# Patient Record
Sex: Female | Born: 1975 | Race: Black or African American | Hispanic: No | Marital: Married | Smoking: Never smoker
Health system: Southern US, Community
[De-identification: ages and names within clinical notes are randomized; demographics above are authoritative.]

## PROBLEM LIST (undated history)

## (undated) DIAGNOSIS — G35 Multiple sclerosis: Secondary | ICD-10-CM

---

## 2019-02-08 ENCOUNTER — Other Ambulatory Visit: Payer: Self-pay

## 2019-02-08 ENCOUNTER — Emergency Department
Admission: EM | Admit: 2019-02-08 | Discharge: 2019-02-08 | Disposition: A | Discharge status: Home / Self Care | Payer: No Typology Code available for payment source | Attending: Emergency Medicine | Admitting: Emergency Medicine

## 2019-02-08 ENCOUNTER — Emergency Department: Payer: No Typology Code available for payment source

## 2019-02-08 ENCOUNTER — Encounter: Payer: Self-pay | Admitting: Intensive Care

## 2019-02-08 DIAGNOSIS — Y999 Unspecified external cause status: Secondary | ICD-10-CM | POA: Insufficient documentation

## 2019-02-08 DIAGNOSIS — Q438 Other specified congenital malformations of intestine: Secondary | ICD-10-CM | POA: Diagnosis not present

## 2019-02-08 DIAGNOSIS — M542 Cervicalgia: Secondary | ICD-10-CM | POA: Diagnosis not present

## 2019-02-08 DIAGNOSIS — Y92410 Unspecified street and highway as the place of occurrence of the external cause: Secondary | ICD-10-CM | POA: Insufficient documentation

## 2019-02-08 DIAGNOSIS — R079 Chest pain, unspecified: Secondary | ICD-10-CM | POA: Diagnosis not present

## 2019-02-08 DIAGNOSIS — Y9389 Activity, other specified: Secondary | ICD-10-CM | POA: Diagnosis not present

## 2019-02-08 DIAGNOSIS — Z3202 Encounter for pregnancy test, result negative: Secondary | ICD-10-CM | POA: Diagnosis not present

## 2019-02-08 DIAGNOSIS — Q458 Other specified congenital malformations of digestive system: Secondary | ICD-10-CM

## 2019-02-08 DIAGNOSIS — R51 Headache: Secondary | ICD-10-CM | POA: Insufficient documentation

## 2019-02-08 DIAGNOSIS — R1030 Lower abdominal pain, unspecified: Secondary | ICD-10-CM | POA: Insufficient documentation

## 2019-02-08 HISTORY — DX: Multiple sclerosis: G35

## 2019-02-08 LAB — CBC WITH DIFFERENTIAL/PLATELET
Abs Immature Granulocytes: 0.04 10*3/uL (ref 0.00–0.07)
Basophils Absolute: 0.1 10*3/uL (ref 0.0–0.1)
Basophils Relative: 0 %
Eosinophils Absolute: 0.1 10*3/uL (ref 0.0–0.5)
Eosinophils Relative: 1 %
HCT: 37.2 % (ref 36.0–46.0)
Hemoglobin: 11.8 g/dL — ABNORMAL LOW (ref 12.0–15.0)
Immature Granulocytes: 0 %
Lymphocytes Relative: 18 %
Lymphs Abs: 2.1 10*3/uL (ref 0.7–4.0)
MCH: 28.9 pg (ref 26.0–34.0)
MCHC: 31.7 g/dL (ref 30.0–36.0)
MCV: 91 fL (ref 80.0–100.0)
Monocytes Absolute: 0.8 10*3/uL (ref 0.1–1.0)
Monocytes Relative: 7 %
Neutro Abs: 8.1 10*3/uL — ABNORMAL HIGH (ref 1.7–7.7)
Neutrophils Relative %: 74 %
Platelets: 289 10*3/uL (ref 150–400)
RBC: 4.09 MIL/uL (ref 3.87–5.11)
RDW: 14.1 % (ref 11.5–15.5)
WBC: 11.2 10*3/uL — ABNORMAL HIGH (ref 4.0–10.5)
nRBC: 0 % (ref 0.0–0.2)

## 2019-02-08 LAB — URINALYSIS, COMPLETE (UACMP) WITH MICROSCOPIC
Bilirubin Urine: NEGATIVE
Glucose, UA: NEGATIVE mg/dL
Ketones, ur: NEGATIVE mg/dL
Nitrite: POSITIVE — AB
Protein, ur: NEGATIVE mg/dL
Specific Gravity, Urine: 1.013 (ref 1.005–1.030)
pH: 5 (ref 5.0–8.0)

## 2019-02-08 LAB — COMPREHENSIVE METABOLIC PANEL
ALT: 12 U/L (ref 0–44)
AST: 13 U/L — ABNORMAL LOW (ref 15–41)
Albumin: 4.2 g/dL (ref 3.5–5.0)
Alkaline Phosphatase: 60 U/L (ref 38–126)
Anion gap: 9 (ref 5–15)
BUN: 22 mg/dL — ABNORMAL HIGH (ref 6–20)
CO2: 23 mmol/L (ref 22–32)
Calcium: 8.9 mg/dL (ref 8.9–10.3)
Chloride: 109 mmol/L (ref 98–111)
Creatinine, Ser: 1.27 mg/dL — ABNORMAL HIGH (ref 0.44–1.00)
GFR calc Af Amer: 59 mL/min — ABNORMAL LOW (ref >60)
GFR calc non Af Amer: 51 mL/min — ABNORMAL LOW (ref >60)
Glucose, Bld: 103 mg/dL — ABNORMAL HIGH (ref 70–99)
Potassium: 3.6 mmol/L (ref 3.5–5.1)
Sodium: 141 mmol/L (ref 135–145)
Total Bilirubin: 0.7 mg/dL (ref 0.3–1.2)
Total Protein: 7.6 g/dL (ref 6.5–8.1)

## 2019-02-08 LAB — PREGNANCY, URINE: Preg Test, Ur: NEGATIVE

## 2019-02-08 LAB — TROPONIN I (HIGH SENSITIVITY): Troponin I (High Sensitivity): <2 ng/L (ref <18)

## 2019-02-08 MED ORDER — METHOCARBAMOL 500 MG PO TABS: 500.0000 mg | ORAL_TABLET | Freq: Three times a day (TID) | ORAL | 0 refills | Status: AC | PRN

## 2019-02-08 MED ORDER — CEPHALEXIN 500 MG PO CAPS: 500.0000 mg | ORAL_CAPSULE | Freq: Three times a day (TID) | ORAL | 0 refills | Status: AC

## 2019-02-08 MED ORDER — IOHEXOL 300 MG/ML  SOLN
100.0000 mL | Freq: Once | INTRAMUSCULAR | Status: AC | PRN
  Administered 2019-02-08: 20:00:00 100 mL via INTRAVENOUS
  Filled 2019-02-08: qty 100

## 2019-02-08 NOTE — ED Notes (Signed)
First Nurse Note: Pt to ED via [facility] from [facility]. Pt c/o pain in LLQ and collar bone area. Pt is in NAD at this time.
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
## 2019-02-08 NOTE — Discharge Instructions (Signed)
Take Robaxin as needed for muscle spasms. Take Keflex 3 times daily for UTI. Follow-up with cardiothoracic surgery regarding duplication cyst of chest.
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
## 2019-02-12 ENCOUNTER — Other Ambulatory Visit: Payer: Self-pay

## 2019-02-12 DIAGNOSIS — Z20822 Contact with and (suspected) exposure to covid-19: Secondary | ICD-10-CM

## 2019-02-13 LAB — NOVEL CORONAVIRUS, NAA: SARS-CoV-2, NAA: NOT DETECTED

## 2019-07-01 ENCOUNTER — Other Ambulatory Visit: Payer: Self-pay

## 2019-07-01 ENCOUNTER — Ambulatory Visit: Payer: HRSA Program | Attending: Internal Medicine

## 2019-07-01 DIAGNOSIS — Z20828 Contact with and (suspected) exposure to other viral communicable diseases: Secondary | ICD-10-CM | POA: Diagnosis present

## 2019-07-01 DIAGNOSIS — Z20822 Contact with and (suspected) exposure to covid-19: Secondary | ICD-10-CM

## 2019-07-02 LAB — NOVEL CORONAVIRUS, NAA: SARS-CoV-2, NAA: NOT DETECTED
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
